# Patient Record
Sex: Female | Born: 1960 | Race: White | Hispanic: No | Marital: Single | Smoking: Never smoker
Health system: Southern US, Community
[De-identification: ages and names within clinical notes are randomized; demographics above are authoritative.]

## PROBLEM LIST (undated history)

## (undated) DIAGNOSIS — I4891 Unspecified atrial fibrillation: Secondary | ICD-10-CM

## (undated) DIAGNOSIS — K219 Gastro-esophageal reflux disease without esophagitis: Secondary | ICD-10-CM

## (undated) HISTORY — PX: EYE SURGERY: SHX253

## (undated) HISTORY — DX: Gastro-esophageal reflux disease without esophagitis: K21.9

---

## 2020-03-19 ENCOUNTER — Other Ambulatory Visit: Payer: Self-pay

## 2020-03-19 ENCOUNTER — Emergency Department
Admission: EM | Admit: 2020-03-19 | Discharge: 2020-03-19 | Disposition: A | Discharge status: Home / Self Care | Payer: Self-pay | Attending: Emergency Medicine | Admitting: Emergency Medicine

## 2020-03-19 ENCOUNTER — Emergency Department: Payer: Self-pay

## 2020-03-19 ENCOUNTER — Encounter: Payer: Self-pay | Admitting: Emergency Medicine

## 2020-03-19 DIAGNOSIS — M25512 Pain in left shoulder: Secondary | ICD-10-CM | POA: Insufficient documentation

## 2020-03-19 DIAGNOSIS — R002 Palpitations: Secondary | ICD-10-CM | POA: Insufficient documentation

## 2020-03-19 DIAGNOSIS — M79622 Pain in left upper arm: Secondary | ICD-10-CM | POA: Insufficient documentation

## 2020-03-19 HISTORY — DX: Unspecified atrial fibrillation: I48.91

## 2020-03-19 LAB — TROPONIN I (HIGH SENSITIVITY)
Troponin I (High Sensitivity): 4 ng/L (ref <18)
Troponin I (High Sensitivity): 4 ng/L (ref <18)

## 2020-03-19 LAB — CBC
HCT: 44.8 % (ref 36.0–46.0)
Hemoglobin: 14.8 g/dL (ref 12.0–15.0)
MCH: 30.3 pg (ref 26.0–34.0)
MCHC: 33 g/dL (ref 30.0–36.0)
MCV: 91.6 fL (ref 80.0–100.0)
Platelets: 377 10*3/uL (ref 150–400)
RBC: 4.89 MIL/uL (ref 3.87–5.11)
RDW: 12.5 % (ref 11.5–15.5)
WBC: 13.4 10*3/uL — ABNORMAL HIGH (ref 4.0–10.5)
nRBC: 0 % (ref 0.0–0.2)

## 2020-03-19 LAB — BASIC METABOLIC PANEL
Anion gap: 9 (ref 5–15)
BUN: 14 mg/dL (ref 8–23)
CO2: 23 mmol/L (ref 22–32)
Calcium: 10 mg/dL (ref 8.9–10.3)
Chloride: 102 mmol/L (ref 98–111)
Creatinine, Ser: 0.64 mg/dL (ref 0.44–1.00)
GFR, Estimated: >60 mL/min (ref >60)
Glucose, Bld: 121 mg/dL — ABNORMAL HIGH (ref 70–99)
Potassium: 3.8 mmol/L (ref 3.5–5.1)
Sodium: 134 mmol/L — ABNORMAL LOW (ref 135–145)

## 2020-03-19 LAB — D-DIMER, QUANTITATIVE: D-Dimer, Quant: <0.27 ug/mL-FEU (ref 0.00–0.50)

## 2020-03-19 MED ORDER — DIAZEPAM 2 MG PO TABS: 2.0000 mg | ORAL_TABLET | Freq: Two times a day (BID) | ORAL | 0 refills | Status: AC | PRN

## 2020-03-19 NOTE — Discharge Instructions (Signed)
Return to the ER immediately for new, worsening, or persistent severe chest pain, palpitations, heart racing, or any of these episodes associated with dizziness, lightheadedness, or shortness of breath.  You may take the Valium if you have an episode of anxiety or palpitations that does not get better after a short time.  If these episodes continue, it is very important for you to follow-up with a cardiologist.  We have provided referral information.

## 2020-03-19 NOTE — ED Provider Notes (Signed)
[facility] Emergency Department Provider Note ____________________________________________   Event Date/Time   First MD Initiated Contact with Patient 03/19/20 1034     (approximate)  I have reviewed the triage vital signs and the nursing notes.   HISTORY  Chief Complaint Chest Pain    HPI Angela Owens is a 60 y.o. female with GERD but no other active medical problems who presents with intermittent chest tightness over the last 3 days, nonexertional, and associated sometimes with pain in the left shoulder and axilla.  The patient reports that she has had this shoulder pain previously.  She states that associated with this she has had several episodes where her heart rate went up to the 110s to 130s, and one time 2 days ago it went up to 160.  These rates were measured on her pulse oximeter at home.  When the heart rate went up she did not note any lightheadedness, near syncope, or shortness of breath.  The patient reports that some of the symptoms have been going on for over a month since her mother and dog both passed away within a short time.  She reports increased stress and anxiety since this time.  She states that she never had symptoms like this before those deaths.  There are no problems to display for this patient.   History reviewed. No pertinent surgical history.  Prior to Admission medications   Medication Sig Start Date End Date Taking? Authorizing Provider  diazepam (VALIUM) 2 MG tablet Take 1 tablet (2 mg total) by mouth every 12 (twelve) hours as needed for anxiety. 03/19/20 03/19/21 Yes Dionne Bucy, MD    Allergies Patient has no known allergies.  No family history on file.  Social History Social History   Tobacco Use  . Smoking status: Never Smoker  . Smokeless tobacco: Never Used  Substance Use Topics  . Alcohol use: Not Currently  . Drug use: Not Currently    Review of Systems  Constitutional: No fever/chills. Eyes:  No visual changes. ENT: No sore throat. Cardiovascular: Positive for intermittent chest pain. Respiratory: Denies shortness of breath. Gastrointestinal: Positive for nausea.  No vomiting or diarrhea. Genitourinary: Negative for dysuria.  Musculoskeletal: Negative for back pain. Skin: Negative for rash. Neurological: Negative for headache.   ____________________________________________   PHYSICAL EXAM:  VITAL SIGNS: ED Triage Vitals  Enc Vitals Group     BP 03/19/20 1011 (!) 153/96     Pulse Rate 03/19/20 1011 99     Resp 03/19/20 1011 14     Temp 03/19/20 1011 98.6 F (37 C)     Temp Source 03/19/20 1011 Oral     SpO2 03/19/20 1011 99 %     Weight 03/19/20 1009 160 lb (72.6 kg)     Height 03/19/20 1009 5\' 5"  (1.651 m)     Head Circumference --      Peak Flow --      Pain Score 03/19/20 1009 2     Pain Loc --      Pain Edu? --      Excl. in GC? --     Constitutional: Alert and oriented.  Anxious appearing, intermittently tearful, but in no acute distress. Eyes: Conjunctivae are normal.  Head: Atraumatic. Nose: No congestion/rhinnorhea. Mouth/Throat: Mucous membranes are moist.   Neck: Normal range of motion.  Cardiovascular: Normal rate, regular rhythm. Grossly normal heart sounds.  Good peripheral circulation. Respiratory: Normal respiratory effort.  No retractions. Lungs CTAB. Gastrointestinal: No distention.  Musculoskeletal: No lower extremity edema.  Extremities warm and well perfused.  Neurologic:  Normal speech and language. No gross focal neurologic deficits are appreciated.  Skin:  Skin is warm and dry. No rash noted. Psychiatric: Mood and affect are normal. Speech and behavior are normal.  ____________________________________________   LABS (all labs ordered are listed, but only abnormal results are displayed)  Labs Reviewed  BASIC METABOLIC PANEL - Abnormal; Notable for the following components:      Result Value   Sodium 134 (*)    Glucose, Bld  121 (*)    All other components within normal limits  CBC - Abnormal; Notable for the following components:   WBC 13.4 (*)    All other components within normal limits  D-DIMER, QUANTITATIVE  TROPONIN I (HIGH SENSITIVITY)  TROPONIN I (HIGH SENSITIVITY)   ____________________________________________  EKG  ED ECG REPORT I, Dionne Bucy, the attending physician, personally viewed and interpreted this ECG.  Date: 03/19/2020 EKG Time: 1010 Rate: 97 Rhythm: normal sinus rhythm QRS Axis: normal Intervals: normal ST/T Wave abnormalities: Nonspecific T wave abnormality Narrative Interpretation: Nonspecific abnormalities with no evidence of acute ischemia  ____________________________________________  RADIOLOGY  Chest x-ray interpreted by me shows no focal infiltrate or edema  ____________________________________________   PROCEDURES  Procedure(s) performed: No  Procedures  Critical Care performed: No ____________________________________________   INITIAL IMPRESSION / ASSESSMENT AND PLAN / ED COURSE  Pertinent labs & imaging results that were available during my care of the patient were reviewed by me and considered in my medical decision making (see chart for details).  60 year old female with no active medical problems presents with atypical chest discomfort over the last few days radiating to the left shoulder and arm along with intermittent episodes of tachycardia although no near syncope or significant shortness of breath during these episodes.  She overall endorses increased stress since recent deaths in the family and states that all of the symptoms started after this.  She has no prior cardiac history (atrial fibrillation was incorrectly documented by the triage RN).  On exam, the patient is anxious and intermittently tearful when relating her stressors but otherwise well-appearing.  Her vital signs are normal except that her heart rate went up to about 110  while she was talking about stressors.  The physical exam is otherwise unremarkable.  EKG is nonischemic.  Overall presentation is consistent with intermittent sinus tachycardia due to anxiety.  The etiology of the chest pain is also likely musculoskeletal, anxiety related, or another benign cause.  I have a low suspicion for ACS.  There is no evidence of PE, aortic dissection, or other vascular etiology given the intermittent nature of the symptoms.  It is possible that the patient could be having paroxysmal atrial fibrillation or another arrhythmia although her EKG here is in sinus and she has maintained a sinus rhythm on the monitor.  We will obtain basic labs, troponins x2, and reassess.  ----------------------------------------- 3:00 PM on 03/19/2020 -----------------------------------------  D-dimer and troponins x2 are all negative.  The chest x-ray shows no acute abnormality.  The patient has had no further episodes of chest tightness or palpitations in the ED and has had no tachydysrhythmia recorded on the monitor.  Given this negative work-up, the patient is appropriate for discharge home.  I recommend cardiology follow-up as she may need a Holter monitor if the symptoms continue.  The patient has no insurance at this time but agreed to follow-up.  I have also prescribed a small quantity  of low-dose Valium for episodes of breakthrough anxiety over the next few days although I discussed the risks of this medication including dependence.  I gave the patient thorough return precautions and she expressed understanding.  ____________________________________________   FINAL CLINICAL IMPRESSION(S) / ED DIAGNOSES  Final diagnoses:  Palpitations      NEW MEDICATIONS STARTED DURING THIS VISIT:  Discharge Medication List as of 03/19/2020  2:42 PM    START taking these medications   Details  diazepam (VALIUM) 2 MG tablet Take 1 tablet (2 mg total) by mouth every 12 (twelve) hours as  needed for anxiety., Starting Fri 03/19/2020, Until Sat 03/19/2021 at 2359, Normal         Note:  This document was prepared using Dragon voice recognition software and may include unintentional dictation errors.   Dionne Bucy, MD 03/19/20 [phone]

## 2020-03-19 NOTE — ED Triage Notes (Signed)
First Nurse Note:  C/O chest pain radiating to left arm and neck x 2 days.  Patient moved to room 13.

## 2020-03-19 NOTE — ED Triage Notes (Signed)
Pt c/o generalized chest tightness since Wednesday at 0400, states radiates to L axilla, states feels nauseated at this time. Pt states has had A-fib attacks up to the 160's. Pt states her mother died approx 2 months ago and her dog died 3 weeks later and states this maybe related. Upon arrival pt with noted mild difficulty speaking, however when speaking with EDT about dog pt noted to be able to speak without difficulty.

## 2020-10-17 ENCOUNTER — Other Ambulatory Visit: Payer: Self-pay

## 2020-10-17 ENCOUNTER — Encounter: Payer: Self-pay | Admitting: Emergency Medicine

## 2020-10-17 ENCOUNTER — Ambulatory Visit
Admission: EM | Admit: 2020-10-17 | Discharge: 2020-10-17 | Disposition: A | Discharge status: Home / Self Care | Payer: Self-pay

## 2020-10-17 DIAGNOSIS — R197 Diarrhea, unspecified: Secondary | ICD-10-CM

## 2020-10-17 NOTE — ED Provider Notes (Signed)
[facility]    CSN: [id number] Arrival date & time: 10/17/20  1914      History   Chief Complaint Chief Complaint  Patient presents with   Diarrhea    HPI Angela Owens is a 60 y.o. female presenting for 1 week history of intermittent loose stools.  Patient says 1 week ago she had about 4-5 episodes of diarrhea a day for couple of days.  Then she says she did not have any diarrhea for 2 days and over the past couple days she has had increased diarrhea again up to 3 episodes per day.  She says she is already had 2 episodes of loose stools today.  She says most of the time she has loose stools in the morning.  She denies any associated fever, fatigue, chills, body aches, abdominal pain, nausea/vomiting, appetite changes, bloating or belching.  Patient denies any bloody stools.  She has been taking Pepto-Bismol and says stools are a little dark due to this.  She admits to eating at [facility] before onset of symptoms but says the chicken was cooked.  She denies any recent travel and has not been on any antibiotics recently.  Denies any contacts with similar symptoms.  Also she does not report any cough, congestion or sore throat and no known COVID exposure.  No history of IBD or IBS.  She does have history of GERD and takes Nexium daily.  She has no other complaints.  HPI  Past Medical History:  Diagnosis Date   A-fib ([facility])     There are no problems to display for this patient.   History reviewed. No pertinent surgical history.  OB History   No obstetric history on file.      Home Medications    Prior to Admission medications   Medication Sig Start Date End Date Taking? Authorizing Provider  esomeprazole (NEXIUM) 20 MG capsule Take 20 mg by mouth daily at 12 noon.   Yes [provider]  diazepam (VALIUM) 2 MG tablet Take 1 tablet (2 mg total) by mouth every 12 (twelve) hours as needed for anxiety. 03/19/20 03/19/21  Dionne Bucy, MD    Family  History History reviewed. No pertinent family history.  Social History Social History   Tobacco Use   Smoking status: Never   Smokeless tobacco: Never  Vaping Use   Vaping Use: Never used  Substance Use Topics   Alcohol use: Not Currently   Drug use: Not Currently     Allergies   Patient has no known allergies.   Review of Systems Review of Systems  Constitutional:  Negative for appetite change, fatigue and fever.  HENT:  Negative for congestion and sore throat.   Respiratory:  Negative for cough.   Gastrointestinal:  Positive for diarrhea. Negative for abdominal distention, abdominal pain, anal bleeding, blood in stool, constipation, nausea, rectal pain and vomiting.  Neurological:  Negative for dizziness and weakness.    Physical Exam Triage Vital Signs ED Triage Vitals  Enc Vitals Group     BP 10/17/20 0841 (!) 140/99     Pulse Rate 10/17/20 0841 88     Resp 10/17/20 0841 14     Temp 10/17/20 0841 98.8 F (37.1 C)     Temp Source 10/17/20 0841 Oral     SpO2 10/17/20 0841 99 %     Weight 10/17/20 0837 160 lb 0.9 oz (72.6 kg)     Height 10/17/20 0837 5\' 5"  (1.651 m)  Head Circumference --      Peak Flow --      Pain Score 10/17/20 0837 0     Pain Loc --      Pain Edu? --      Excl. in GC? --    No data found.  Updated Vital Signs BP (!) 140/99 (BP Location: Left Arm)   Pulse 88   Temp 98.8 F (37.1 C) (Oral)   Resp 14   Ht 5\' 5"  (1.651 m)   Wt 160 lb 0.9 oz (72.6 kg)   SpO2 99%   BMI 26.63 kg/m      Physical Exam Vitals and nursing note reviewed.  Constitutional:      General: She is not in acute distress.    Appearance: Normal appearance. She is not ill-appearing or toxic-appearing.  HENT:     Head: Normocephalic and atraumatic.     Nose: Nose normal.     Mouth/Throat:     Mouth: Mucous membranes are moist.     Pharynx: Oropharynx is clear.  Eyes:     General: No scleral icterus.       Right eye: No discharge.        Left eye: No  discharge.     Conjunctiva/sclera: Conjunctivae normal.  Cardiovascular:     Rate and Rhythm: Normal rate and regular rhythm.     Heart sounds: Normal heart sounds.  Pulmonary:     Effort: Pulmonary effort is normal. No respiratory distress.     Breath sounds: Normal breath sounds.  Abdominal:     Palpations: Abdomen is soft.     Tenderness: There is no abdominal tenderness.  Musculoskeletal:     Cervical back: Neck supple.  Skin:    General: Skin is dry.  Neurological:     General: No focal deficit present.     Mental Status: She is alert. Mental status is at baseline.     Motor: No weakness.     Gait: Gait normal.  Psychiatric:        Mood and Affect: Mood normal.        Behavior: Behavior normal.        Thought Content: Thought content normal.     UC Treatments / Results  Labs (all labs ordered are listed, but only abnormal results are displayed) Labs Reviewed - No data to display  EKG   Radiology No results found.  Procedures Procedures (including critical care time)  Medications Ordered in UC Medications - No data to display  Initial Impression / Assessment and Plan / UC Course  I have reviewed the triage vital signs and the nursing notes.  Pertinent labs & imaging results that were available during my care of the patient were reviewed by me and considered in my medical decision making (see chart for details).  60 year old female presenting for 1 week history of intermittent diarrhea.  Symptoms seem to get better for couple days but then worsened over the past 2 to 3 days.  Patient admits to up to 3 episodes of diarrhea per day but has been taking Pepto-Bismol.  No systemic symptoms or red flag signs or symptoms.  No recent antibiotic use or travel.  Vitals are stable.  She is afebrile and overall well-appearing.  She does not admit to feeling badly.  She is concerned because of how long she has had the diarrhea.  Exam is reassuring.  Normal bowel sounds and  abdomen is soft and nontender.  I did discuss  with patient that her symptoms likely due to viral gastroenteritis and suggested continue with supportive care but may be switching to Imodium and staying hydrated.  Patient did want to consider stool studies.  I have given her samples to collect GI panel by PCR and C. difficile.  Patient to wait 2 to 3 days before returning with samples unless symptoms worsen.  Reviewed ED precautions for diarrhea with patient.  Final Clinical Impressions(s) / UC Diagnoses   Final diagnoses:  Diarrhea, unspecified type     Discharge Instructions      -As we discussed there are many different causes for diarrhea.  I have low suspicion for infectious cause given that you do not have fever or blood in the stool and you do not feel poorly and denies any abdominal pain or bloating.  Additionally you are reporting 2-3 abnormal stools per day when most of the time people complain of 4-5 episodes per day with infectious causes. -Stop Pepto-Bismol and try Imodium and make sure you are staying hydrated.  Also see the handout on food choices to help relieve diarrhea.  If still having symptoms in 2 to 3 days or symptoms worsen, return with a stool sample so we can check you for infectious causes of diarrhea. -For any acute worsening symptoms such as if you develop abdominal pain, fever, weakness, dehydration, etc. please go to the emergency department.     ED Prescriptions   None    PDMP not reviewed this encounter.   Shirlee Latch, PA-C 10/17/20 [phone]

## 2020-10-17 NOTE — ED Triage Notes (Signed)
Patient reports having diarrhea for the past week.  Patient states that she went to a football game party and ate some chicken.  Patient reports 2 episodes of diarrhea for the past 2-3 days.  Patient denies fevers.

## 2020-10-17 NOTE — Discharge Instructions (Signed)
-  As we discussed there are many different causes for diarrhea.  I have low suspicion for infectious cause given that you do not have fever or blood in the stool and you do not feel poorly and denies any abdominal pain or bloating.  Additionally you are reporting 2-3 abnormal stools per day when most of the time people complain of 4-5 episodes per day with infectious causes. -Stop Pepto-Bismol and try Imodium and make sure you are staying hydrated.  Also see the handout on food choices to help relieve diarrhea.  If still having symptoms in 2 to 3 days or symptoms worsen, return with a stool sample so we can check you for infectious causes of diarrhea. -For any acute worsening symptoms such as if you develop abdominal pain, fever, weakness, dehydration, etc. please go to the emergency department.

## 2020-10-18 ENCOUNTER — Other Ambulatory Visit
Admission: RE | Admit: 2020-10-18 | Discharge: 2020-10-18 | Disposition: A | Discharge status: Home / Self Care | Payer: Self-pay | Source: Ambulatory Visit | Attending: Physician Assistant | Admitting: Physician Assistant

## 2020-10-18 DIAGNOSIS — R197 Diarrhea, unspecified: Secondary | ICD-10-CM | POA: Insufficient documentation

## 2020-10-18 LAB — GASTROINTESTINAL PANEL BY PCR, STOOL (REPLACES STOOL CULTURE)

## 2020-10-18 LAB — C DIFFICILE QUICK SCREEN W PCR REFLEX
C Diff antigen: NEGATIVE
C Diff interpretation: NOT DETECTED
C Diff toxin: NEGATIVE

## 2021-01-19 ENCOUNTER — Ambulatory Visit
Admission: EM | Admit: 2021-01-19 | Discharge: 2021-01-19 | Disposition: A | Discharge status: Home / Self Care | Payer: Self-pay | Attending: Medical Oncology | Admitting: Medical Oncology

## 2021-01-19 ENCOUNTER — Other Ambulatory Visit: Payer: Self-pay

## 2021-01-19 DIAGNOSIS — R21 Rash and other nonspecific skin eruption: Secondary | ICD-10-CM

## 2021-01-19 MED ORDER — CLINDAMYCIN HCL 300 MG PO CAPS: 300.0000 mg | ORAL_CAPSULE | Freq: Three times a day (TID) | ORAL | 0 refills | Status: AC

## 2021-01-19 MED ORDER — FLUCONAZOLE 150 MG PO TABS: 150.0000 mg | ORAL_TABLET | Freq: Every day | ORAL | 0 refills | Status: DC

## 2021-01-19 NOTE — ED Triage Notes (Signed)
Pt here with C/O rash under arm pits for 2 months, started right after shaving. Has not been able to get rid of it, woke up with morning and it has spread to top of breast and back.

## 2021-01-19 NOTE — ED Provider Notes (Signed)
[facility]    CSN: [id number] Arrival date & time: 01/19/21  0846      History   Chief Complaint Chief Complaint  Patient presents with   Rash    HPI Angela Owens is a 61 y.o. female.   HPI  Rash: Patient states that she has had a rash of her underarms for the past 2 months. Started after shaving. No new products when symptoms started but has now started using a new body wash and oatmeal lotion. Has tried salicylic acid pads and benzyl peroxide wash along with topical antibacterial with some scattered improvement. As of today she has a rash that spreads below underarms to be bilateral breast. No itching, pain or fevers.   Past Medical History:  Diagnosis Date   A-fib ([place])     There are no problems to display for this patient.   History reviewed. No pertinent surgical history.  OB History   No obstetric history on file.      Home Medications    Prior to Admission medications   Medication Sig Start Date End Date Taking? Authorizing Provider  diazepam (VALIUM) 2 MG tablet Take 1 tablet (2 mg total) by mouth every 12 (twelve) hours as needed for anxiety. 03/19/20 03/19/21 Yes Arta Silence, MD  esomeprazole (NEXIUM) 20 MG capsule Take 20 mg by mouth daily at 12 noon.   Yes [provider]    Family History History reviewed. No pertinent family history.  Social History Social History   Tobacco Use   Smoking status: Never   Smokeless tobacco: Never  Vaping Use   Vaping Use: Never used  Substance Use Topics   Alcohol use: Not Currently   Drug use: Not Currently     Allergies   Patient has no known allergies.   Review of Systems Review of Systems  As stated above in HPI Physical Exam Triage Vital Signs ED Triage Vitals [01/19/21 0945]  Enc Vitals Group     BP (!) 155/100     Pulse Rate 85     Resp 18     Temp 99 F (37.2 C)     Temp Source Oral     SpO2 98 %     Weight      Height      Head Circumference      Peak  Flow      Pain Score 0     Pain Loc      Pain Edu?      Excl. in [place]?    No data found.  Updated Vital Signs BP (!) 155/100 (BP Location: Right Arm) Comment: Pt had a upset moment right before taking BP   Pulse 85    Temp 99 F (37.2 C) (Oral)    Resp 18    SpO2 98%   Physical Exam Cardiovascular:     Rate and Rhythm: Normal rate and regular rhythm.     Heart sounds: Normal heart sounds.  Pulmonary:     Effort: Pulmonary effort is normal.     Breath sounds: Normal breath sounds.  Skin:    General: Skin is warm.     Comments: See below        UC Treatments / Results  Labs (all labs ordered are listed, but only abnormal results are displayed) Labs Reviewed - No data to display  EKG   Radiology No results found.  Procedures Procedures (including critical care time)  Medications Ordered in UC Medications - No  data to display  Initial Impression / Assessment and Plan / UC Course  I have reviewed the triage vital signs and the nursing notes.  Pertinent labs & imaging results that were available during my care of the patient were reviewed by me and considered in my medical decision making (see chart for details).     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 Treating with clindamycin and diflucan. Discussed with patient along with avoidance of items that can cause irritation. She will wash and avoid use of scented soaps.    Final Clinical Impressions(s) / UC Diagnoses   Final diagnoses:  None   Discharge Instructions   None    ED Prescriptions   None    PDMP not reviewed this encounter.   Hughie Closs, [place] 01/19/21 1037

## 2022-03-17 IMAGING — CR DG CHEST 2V
2 series · 2 of 2 positions shown · non-contrast
Comparison: None.

CLINICAL DATA: Chest pain

EXAM:
CHEST - 2 VIEW

[chest pa]
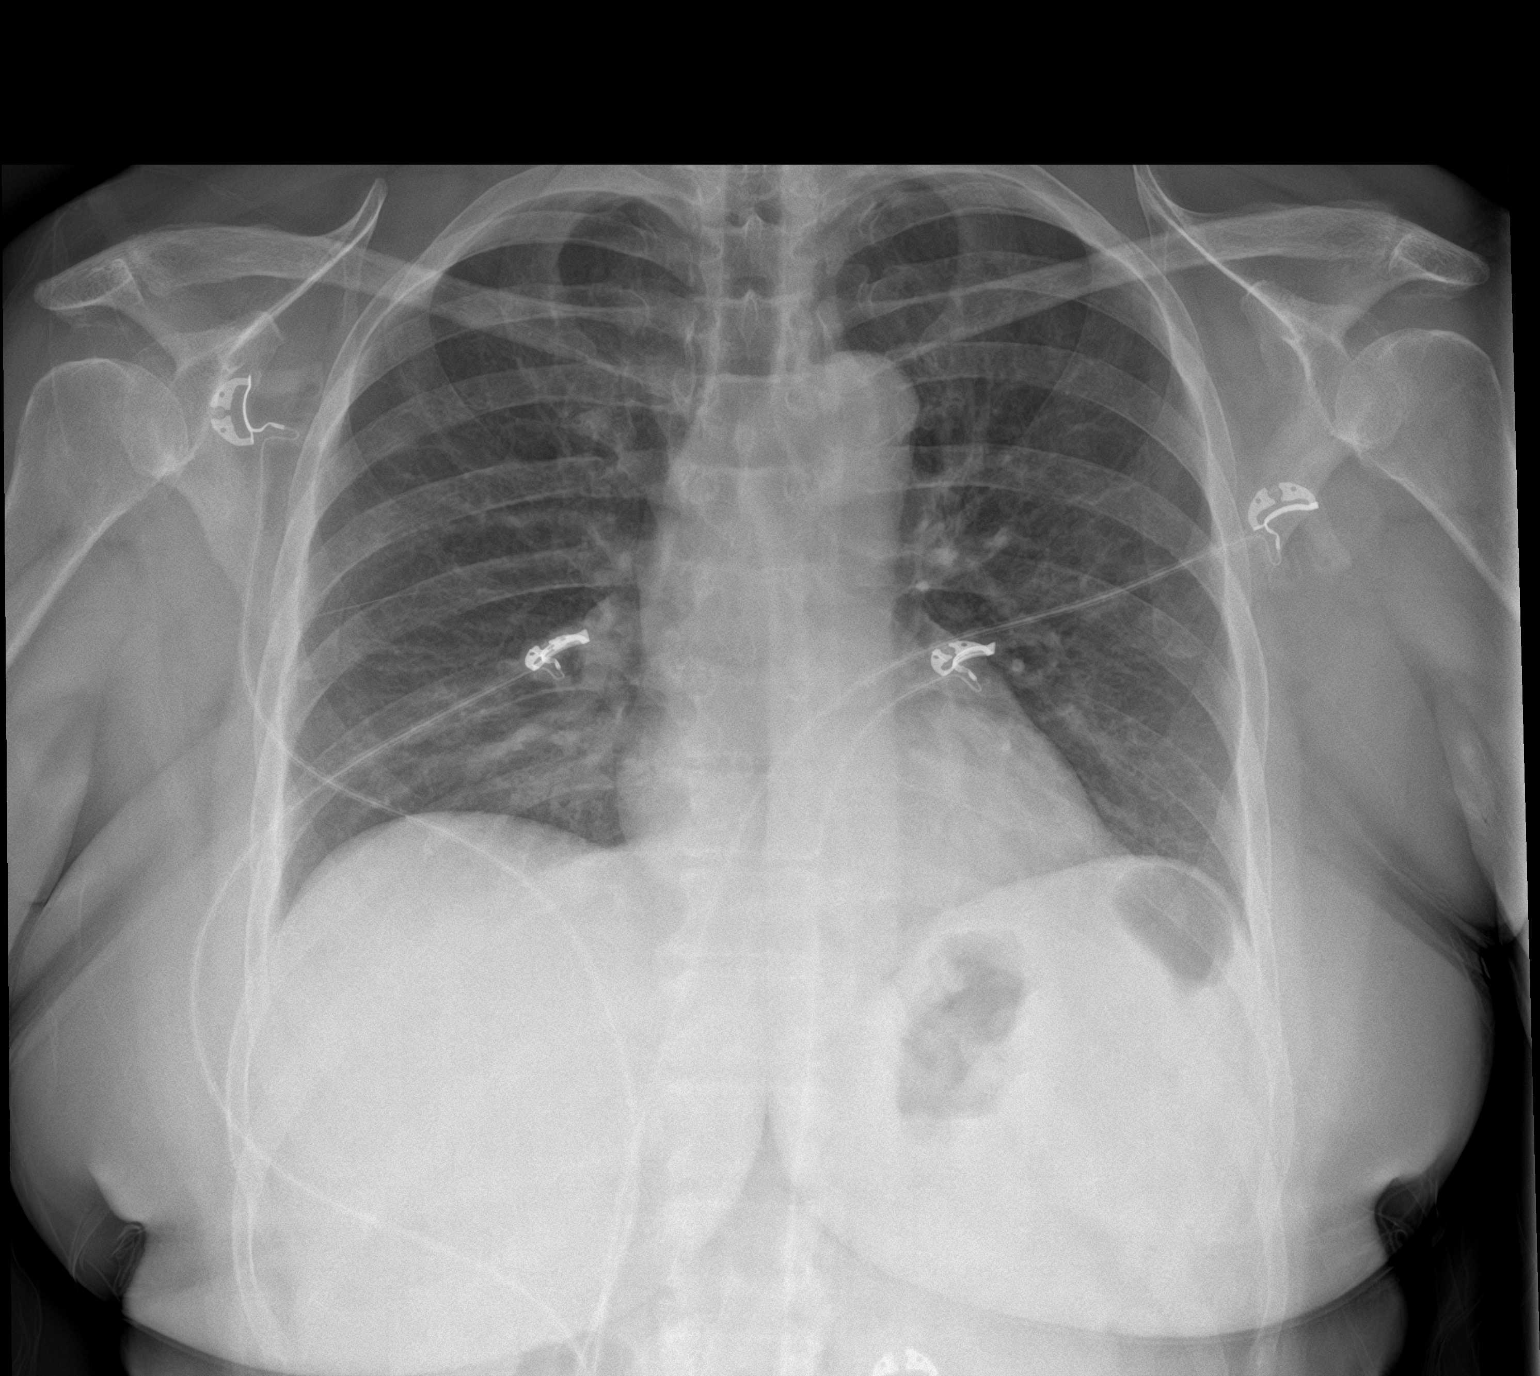

[chest lat]
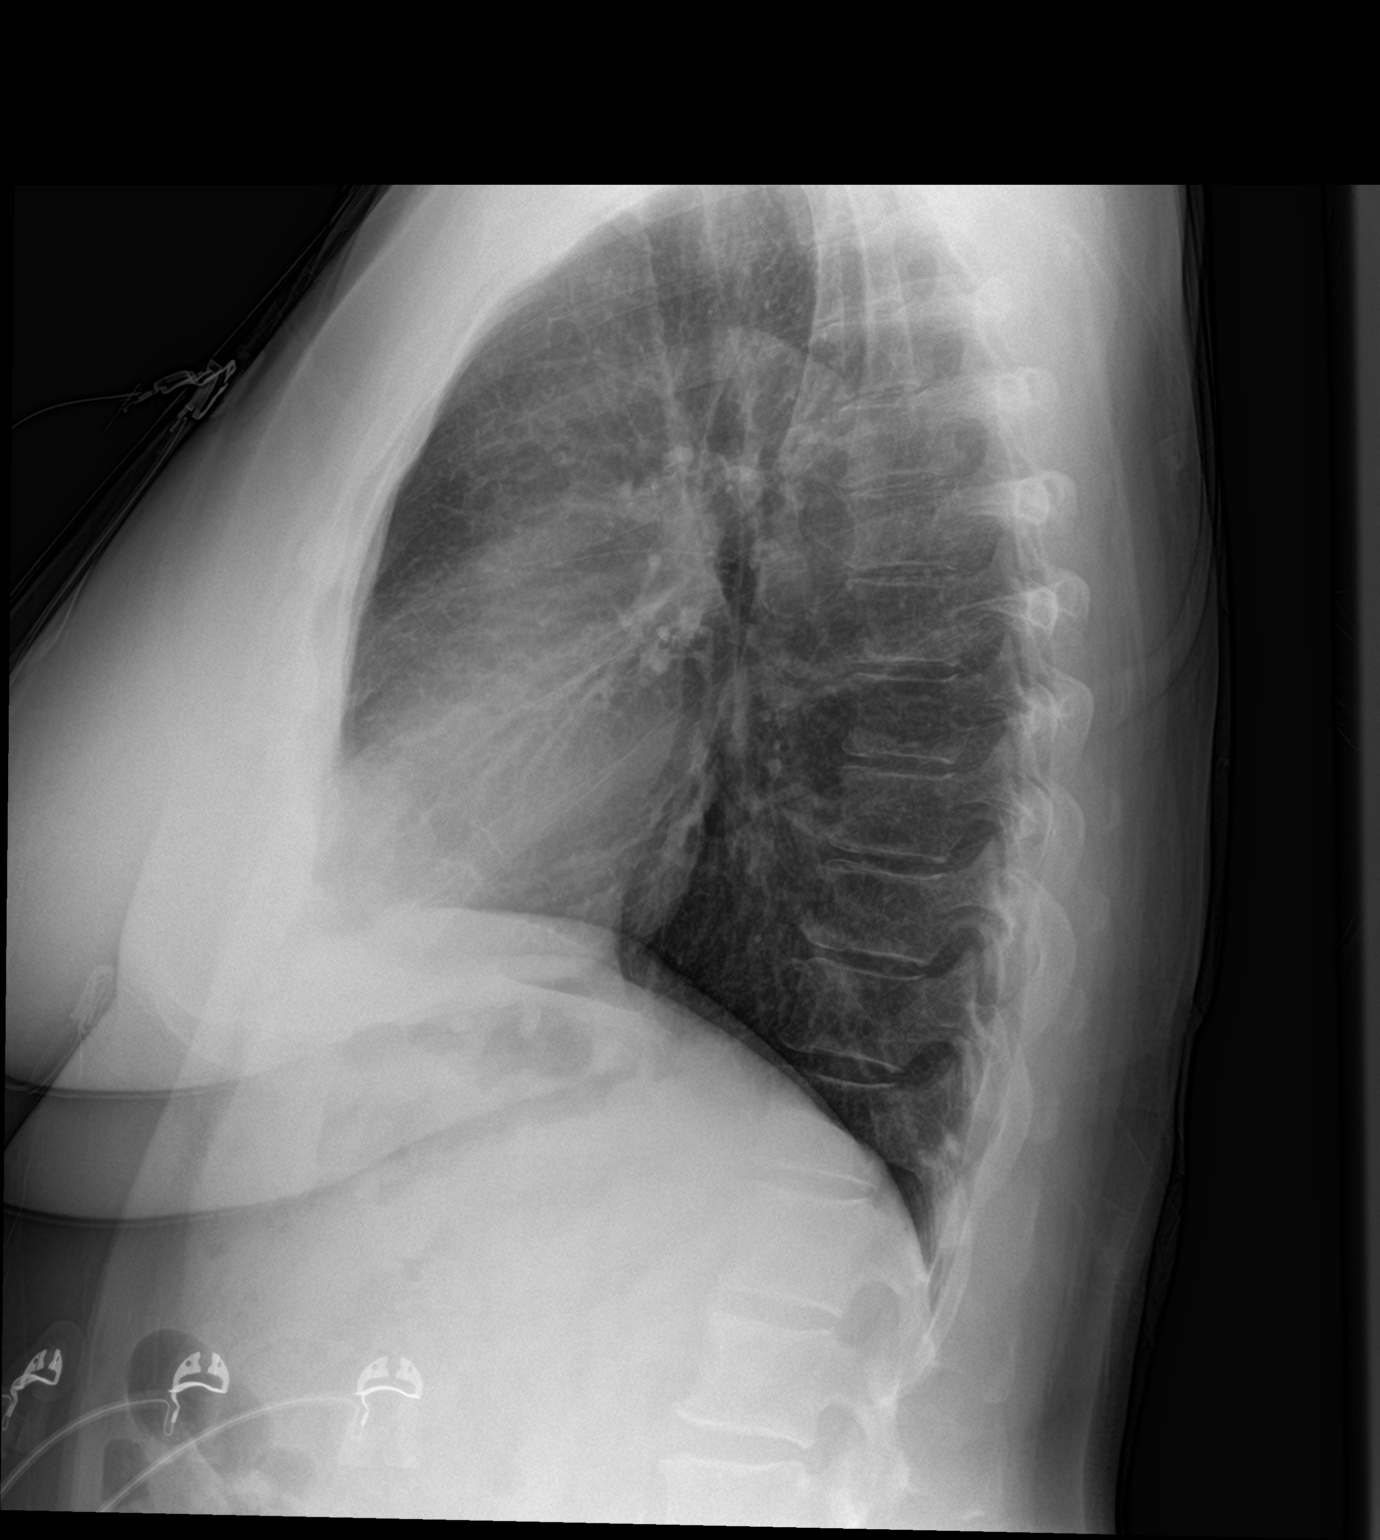

[2 of 2 positions shown; findings below may reference images not displayed]

FINDINGS: Heart and mediastinal contours are within normal limits. No focal
opacities or effusions. No acute bony abnormality.
IMPRESSION: No active cardiopulmonary disease.

## 2023-09-12 ENCOUNTER — Encounter: Payer: Self-pay | Admitting: Family Medicine

## 2023-09-12 ENCOUNTER — Ambulatory Visit (INDEPENDENT_AMBULATORY_CARE_PROVIDER_SITE_OTHER): Payer: Self-pay | Admitting: Family Medicine

## 2023-09-12 VITALS — BP 160/94 | HR 67 | Resp 16 | Ht 65.0 in | Wt 171.6 lb

## 2023-09-12 DIAGNOSIS — Z131 Encounter for screening for diabetes mellitus: Secondary | ICD-10-CM

## 2023-09-12 DIAGNOSIS — R030 Elevated blood-pressure reading, without diagnosis of hypertension: Secondary | ICD-10-CM

## 2023-09-12 DIAGNOSIS — K219 Gastro-esophageal reflux disease without esophagitis: Secondary | ICD-10-CM

## 2023-09-12 DIAGNOSIS — Z7689 Persons encountering health services in other specified circumstances: Secondary | ICD-10-CM

## 2023-09-12 DIAGNOSIS — Z13220 Encounter for screening for lipoid disorders: Secondary | ICD-10-CM

## 2023-09-12 DIAGNOSIS — Z1329 Encounter for screening for other suspected endocrine disorder: Secondary | ICD-10-CM

## 2023-09-12 NOTE — Progress Notes (Signed)
 New Patient Office Visit  Subjective   Patient ID: Angela Owens, female    DOB: June 23, 1960  Age: 63 y.o. MRN: [id number]  CC:  Chief Complaint  Patient presents with   Establish Care   Discussed the use of AI scribe software for clinical note transcription with the patient, who gave verbal consent to proceed.  History of Present Illness   Angela Owens is a 63 year old female who presents to establish care at [facility] after recently obtaining Medicare.  She has a history of atrial fibrillation that occurred during a massive panic attack following her mother's death. She was evaluated in the emergency room with an EKG showing NSR, blood work, and other tests to rule out heart attack and stroke, all of which were normal. She was treated with Valium  and has not experienced similar episodes since. No current symptoms of heart problems or panic attacks.  She has long-standing gastroesophageal reflux disease (GERD) that began at age 21. She has been taking Nexium for 35 years after switching from Prilosec due to its ineffectiveness. Attempts to discontinue Nexium resulted in increased acid production, so she continues to take it regularly. She has also tried Zantac in the past but has not used it for many years. She reports that as long as she takes Nexium, she does not have a problem with acid reflux.  She reports elevated blood pressure readings in medical settings, which she attributes to 'white coat hypertension.' She monitors her blood pressure at home using an Omron machine, noting that her readings are typically lower outside of medical environments. She provided a log of her home blood pressure readings taken over the past four days.  She has not had a comprehensive blood workup since her emergency room visit, where her kidney and liver function were checked and reported as normal. She is unsure of her current cholesterol levels and dietary impact on her health, noting that she eats 'like a  ten year old' and lives alone. She tries to avoid sugar and reads labels to manage her salt intake, favoring low-salt options like [employer] dinners.  She has never smoked and has not been sexually active since her boyfriend passed away in 09/29/2016. She has no plans to undergo further Pap smears, as she is past the age of 36 and not sexually active.   Home blood pressure readings: Average 125/74  123/75, 130/73 Sunday  120/77, 129/74 2023/09/30  123/71, 130/74 Tuesday  119/72, 128/75 Wednesday  HR 70-80s   Outpatient Encounter Medications as of 09/12/2023  Medication Sig   Coenzyme Q10 (CO Q-10) 100 MG CAPS Take by mouth.   [employer] Omega-3 Krill Oil 500 MG CAPS Take by mouth.   esomeprazole (NEXIUM) 20 MG capsule Take 20 mg by mouth daily at 12 noon.   Multiple Vitamins-Minerals (CENTRUM ADULTS) TABS Take by mouth.   Probiotic Product (PROBIOTIC & ACIDOPHILUS EX ST PO) Take by mouth.   [DISCONTINUED] fluconazole  (DIFLUCAN ) 150 MG tablet Take 1 tablet (150 mg total) by mouth daily.   No facility-administered encounter medications on file as of 09/12/2023.    There are no active problems to display for this patient.  Past Medical History:  Diagnosis Date   GERD (gastroesophageal reflux disease) 1990   Past Surgical History:  Procedure Laterality Date   EYE SURGERY  09-30-1998   Lasik   Family History  Problem Relation Age of Onset   Chronic infections Mother    COPD Father    Social History  Socioeconomic History   Marital status: Single    Spouse name: Not on file   Number of children: Not on file   Years of education: Not on file   Highest education level: Bachelor's degree (e.g., BA, AB, BS)  Occupational History   Not on file  Tobacco Use   Smoking status: Never    Passive exposure: Never   Smokeless tobacco: Never  Vaping Use   Vaping status: Never Used  Substance and Sexual Activity   Alcohol use: Not Currently   Drug use: Never   Sexual activity: Not Currently    Birth  control/protection: Post-menopausal  Other Topics Concern   Not on file  Social History Narrative   Not on file   Social Drivers of Health   Financial Resource Strain: Low Risk  (09/05/2023)   Overall Financial Resource Strain (CARDIA)    Difficulty of Paying Living Expenses: Not hard at all  Food Insecurity: No Food Insecurity (09/05/2023)   Hunger Vital Sign    Worried About Running Out of Food in the Last Year: Never true    Ran Out of Food in the Last Year: Never true  Transportation Needs: No Transportation Needs (09/05/2023)   PRAPARE - Administrator, Civil Service (Medical): No    Lack of Transportation (Non-Medical): No  Physical Activity: Insufficiently Active (09/05/2023)   Exercise Vital Sign    Days of Exercise per Week: 3 days    Minutes of Exercise per Session: 30 min  Stress: No Stress Concern Present (09/05/2023)   [employer] of Occupational Health - Occupational Stress Questionnaire    Feeling of Stress: Not at all  Social Connections: Socially Isolated (09/05/2023)   Social Connection and Isolation Panel    Frequency of Communication with Friends and Family: More than three times a week    Frequency of Social Gatherings with Friends and Family: More than three times a week    Attends Religious Services: Never    Database administrator or Organizations: No    Attends Engineer, structural: Not on file    Marital Status: Divorced  Intimate Partner Violence: Not on file   Outpatient Medications Prior to Visit  Medication Sig Dispense Refill   Coenzyme Q10 (CO Q-10) 100 MG CAPS Take by mouth.     CVS Omega-3 Krill Oil 500 MG CAPS Take by mouth.     esomeprazole (NEXIUM) 20 MG capsule Take 20 mg by mouth daily at 12 noon.     Multiple Vitamins-Minerals (CENTRUM ADULTS) TABS Take by mouth.     Probiotic Product (PROBIOTIC & ACIDOPHILUS EX ST PO) Take by mouth.     fluconazole  (DIFLUCAN ) 150 MG tablet Take 1 tablet (150 mg total) by mouth  daily. 1 tablet 0   No facility-administered medications prior to visit.   No Known Allergies  ROS: see HPI    Objective   Today's Vitals   09/12/23 1308  BP: (!) 160/94  Pulse: 67  Resp: 16  SpO2: 98%  Weight: 171 lb 9.6 oz (77.8 kg)  Height: 5' 5 (1.651 m)  PainSc: 0-No pain    GENERAL: Well-appearing, in NAD. Well nourished.  SKIN: Pink, warm and dry. No rash, lesion, ulceration, or ecchymoses.  Head: Normocephalic. NECK: Trachea midline. Full ROM w/o pain or tenderness. No lymphadenopathy.  EARS: Tympanic membranes are intact, translucent without bulging and without drainage. Appropriate landmarks visualized.  EYES: Conjunctiva clear without exudates. EOMI, PERRL, no drainage present.  NOSE:  Septum midline w/o deformity. Nares patent, mucosa pink and non-inflamed w/o drainage. No sinus tenderness.  THROAT: Uvula midline. Oropharynx clear. Tonsils non-inflamed without exudate. Mucous membranes pink and moist.  RESPIRATORY: Chest wall symmetrical. Respirations even and non-labored. Breath sounds clear to auscultation bilaterally.  CARDIAC: S1, S2 present, regular rate and rhythm without murmur or gallops. Peripheral pulses 2+ bilaterally.  MSK: Muscle tone and strength appropriate for age. Joints w/o tenderness, redness, or swelling.  EXTREMITIES: Without clubbing, cyanosis, or edema.  NEUROLOGIC: No motor or sensory deficits. Steady, even gait. C2-C12 intact.  PSYCH/MENTAL STATUS: Alert, oriented x 3. Cooperative, appropriate mood and affect.     Assessment & Plan:   1. Encounter to establish care (Primary) Patient is a 53- year-old female who presents today to establish care with primary care at [facility]. Reviewed the past medical history, family history, social history, surgical history, medications and allergies today- updates made as indicated. Patient has concerns today about concerns about blood work.    2. Elevated blood pressure reading in office without  diagnosis of hypertension Elevated blood pressure in clinical settings, normal at home, indicating anxiety-induced elevation. Continue home blood pressure monitoring. Record blood pressure readings twice daily, including midday, for future visits. Discussed dietary habits, high salt intake, and lack of recent blood work. Declined Pap smear due to age and lack of sexual activity. Order comprehensive blood work excluding cholesterol panel. Discuss dietary modifications to reduce salt intake. - CBC with Differential/Platelet - Comprehensive metabolic panel with GFR  3. Screening for cholesterol level Schedule fasting cholesterol panel for next visit. - Lipid panel; Future  4. Screening for thyroid disorder Will obtain lab work for thyroid function.  - TSH Rfx on Abnormal to Free T4  5. Screening for diabetes mellitus Will obtain HbA1c due to history of elevated glucose in the past.  - Hemoglobin A1c  6. Gastroesophageal reflux disease, unspecified whether esophagitis present Chronic GERD well-controlled with Nexium. Attempts to discontinue Nexium unsuccessful due to increased acid production. Long-term Nexium use remains necessary and effective.     Return in about 6 weeks (around 10/24/2023) for welcome to Medicare AWV .   Evalene Arts, FNP

## 2023-09-12 NOTE — Patient Instructions (Addendum)

## 2023-09-13 LAB — COMPREHENSIVE METABOLIC PANEL WITH GFR
ALT: 14 IU/L (ref 0–32)
AST: 19 IU/L (ref 0–40)
Albumin: 4.7 g/dL (ref 3.9–4.9)
Alkaline Phosphatase: 95 IU/L (ref 44–121)
BUN/Creatinine Ratio: 20 (ref 12–28)
BUN: 16 mg/dL (ref 8–27)
Bilirubin Total: 0.2 mg/dL (ref 0.0–1.2)
CO2: 19 mmol/L — ABNORMAL LOW (ref 20–29)
Calcium: 10.4 mg/dL — ABNORMAL HIGH (ref 8.7–10.3)
Chloride: 102 mmol/L (ref 96–106)
Creatinine, Ser: 0.8 mg/dL (ref 0.57–1.00)
Globulin, Total: 2.8 g/dL (ref 1.5–4.5)
Glucose: 93 mg/dL (ref 70–99)
Potassium: 4.8 mmol/L (ref 3.5–5.2)
Sodium: 141 mmol/L (ref 134–144)
Total Protein: 7.5 g/dL (ref 6.0–8.5)
eGFR: 82 mL/min/1.73 (ref >59)

## 2023-09-13 LAB — TSH RFX ON ABNORMAL TO FREE T4: TSH: 2.49 u[IU]/mL (ref 0.450–4.500)

## 2023-09-13 LAB — CBC WITH DIFFERENTIAL/PLATELET
Basophils Absolute: 0.1 x10E3/uL (ref 0.0–0.2)
Basos: 1 %
EOS (ABSOLUTE): 0.2 x10E3/uL (ref 0.0–0.4)
Eos: 2 %
Hematocrit: 46.2 % (ref 34.0–46.6)
Hemoglobin: 15.1 g/dL (ref 11.1–15.9)
Immature Grans (Abs): 0 x10E3/uL (ref 0.0–0.1)
Immature Granulocytes: 0 %
Lymphocytes Absolute: 3.4 x10E3/uL — ABNORMAL HIGH (ref 0.7–3.1)
Lymphs: 30 %
MCH: 30.1 pg (ref 26.6–33.0)
MCHC: 32.7 g/dL (ref 31.5–35.7)
MCV: 92 fL (ref 79–97)
Monocytes Absolute: 0.7 x10E3/uL (ref 0.1–0.9)
Monocytes: 6 %
Neutrophils Absolute: 6.8 x10E3/uL (ref 1.4–7.0)
Neutrophils: 61 %
Platelets: 392 x10E3/uL (ref 150–450)
RBC: 5.02 x10E6/uL (ref 3.77–5.28)
RDW: 12.8 % (ref 11.7–15.4)
WBC: 11.1 x10E3/uL — ABNORMAL HIGH (ref 3.4–10.8)

## 2023-09-13 LAB — HEMOGLOBIN A1C
Est. average glucose Bld gHb Est-mCnc: 117 mg/dL
Hgb A1c MFr Bld: 5.7 % — ABNORMAL HIGH (ref 4.8–5.6)

## 2023-09-14 ENCOUNTER — Telehealth: Payer: Self-pay | Admitting: Family Medicine

## 2023-09-14 NOTE — Telephone Encounter (Signed)
 Angela Owens is out of the office today 8/29. Routing to her for review in case she is able to look at messages outside of the office.

## 2023-09-14 NOTE — Telephone Encounter (Signed)
 Patient walked in stating labs show her white blood cell count was elevated.  She is is scheduled for a pneumonia shot on 09/20/2023.  Want to know should she take the pneumonia shot.  Please call her to advise before the appt.

## 2023-09-19 ENCOUNTER — Ambulatory Visit: Payer: Self-pay | Admitting: Family Medicine

## 2023-09-24 ENCOUNTER — Encounter: Payer: Self-pay | Admitting: Family Medicine

## 2023-09-24 ENCOUNTER — Ambulatory Visit (INDEPENDENT_AMBULATORY_CARE_PROVIDER_SITE_OTHER): Admitting: Family Medicine

## 2023-09-24 VITALS — BP 128/91 | HR 84 | Resp 16 | Ht 65.0 in | Wt 169.0 lb

## 2023-09-24 DIAGNOSIS — Z0000 Encounter for general adult medical examination without abnormal findings: Secondary | ICD-10-CM | POA: Diagnosis not present

## 2023-09-24 NOTE — Progress Notes (Signed)
 [facility]  Annual Wellness Visit     Patient: Angela Owens, Female    DOB: [birth year], 63 y.o.   MRN: [id number]  Subjective  Chief Complaint  Patient presents with   Medicare Wellness   Discussed the use of AI scribe software for clinical note transcription with the patient, who gave verbal consent to proceed.  Angela Owens is a 63 y.o. female who presents today for her Annual Wellness Visit. She reports consuming a general diet. She engages in regular physical activity, using an elliptical machine for 5,000 steps approximately five days a week and playing golf once a week, except during the summer due to the heat. She generally feels well. She reports sleeping well.  Her sleep is good, with no issues falling or staying asleep. She mentions her sister struggles with sleep, but she does not share this problem.She does not have additional problems to discuss today.   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 She was in a band and frequently played in bars, which contributed to her decision to stop drinking. She has since distanced herself from that lifestyle and those friends.  She monitors her blood pressure at home, recording daily readings at noon. Recent readings include 122/72, 127/77, 126/76, and 118/72. Her blood pressure has always been around 130/70 since college.  She does not take any medications and has no updates to her family history. Her mother passed away after a brief illness, and she experienced significant stress managing her mother's estate, which led to a panic attack or similar episode.  No changes in vision except for difficulty with reading up close. Had Lasix surgery in 2000. No issues with distance vision or driving at night. She does not have an eye doctor or dentist and only seeks medical care when necessary. Not interested in any  preventative care/cancer screenings.      Patient Active Problem List   Diagnosis Date Noted   Encounter for initial annual wellness visit (AWV) in Medicare patient 09/24/2023   Past Medical History:  Diagnosis Date   GERD (gastroesophageal reflux disease) 1990   Past Surgical History:  Procedure Laterality Date   EYE SURGERY  2000   Lasik   Social History   Tobacco Use   Smoking status: Never    Passive exposure: Never   Smokeless tobacco: Never  Vaping Use   Vaping status: Never Used  Substance Use Topics   Alcohol use: Not Currently    Comment: quit 15 yrs   Drug use: Never   Social History   Socioeconomic History   Marital status: Single    Spouse name: Not on file   Number of children: Not on file   Years of education: Not on file   Highest education level: Bachelor's degree (e.g., BA, AB, BS)  Occupational History   Not on file  Tobacco Use   Smoking status: Never    Passive exposure: Never   Smokeless tobacco: Never  Vaping Use   Vaping status: Never Used  Substance and Sexual Activity   Alcohol use: Not Currently    Comment: quit 15 yrs   Drug use: Never   Sexual activity: Not Currently    Birth control/protection: Post-menopausal  Other Topics Concern   Not on file  Social History Narrative   Not on file   Social Drivers of Health  Financial Resource Strain: Low Risk  (09/05/2023)   Overall Financial Resource Strain (CARDIA)    Difficulty of Paying Living Expenses: Not hard at all  Food Insecurity: No Food Insecurity (09/05/2023)   Hunger Vital Sign    Worried About Running Out of Food in the Last Year: Never true    Ran Out of Food in the Last Year: Never true  Transportation Needs: No Transportation Needs (09/05/2023)   PRAPARE - Administrator, Civil Service (Medical): No    Lack of Transportation (Non-Medical): No  Physical Activity: Insufficiently Active (09/05/2023)   Exercise Vital Sign    Days of Exercise per Week: 3 days     Minutes of Exercise per Session: 30 min  Stress: No Stress Concern Present (09/05/2023)   [employer] of Occupational Health - Occupational Stress Questionnaire    Feeling of Stress: Not at all  Social Connections: Socially Isolated (09/05/2023)   Social Connection and Isolation Panel    Frequency of Communication with Friends and Family: More than three times a week    Frequency of Social Gatherings with Friends and Family: More than three times a week    Attends Religious Services: Never    Database administrator or Organizations: No    Attends Engineer, structural: Not on file    Marital Status: Divorced  Catering manager Violence: Not on file   Family Status  Relation Name Status   Mother  Deceased   Father Father Deceased  No partnership data on file   Family History  Problem Relation Age of Onset   Chronic infections Mother    COPD Father    No Known Allergies  Medications: Outpatient Medications Prior to Visit  Medication Sig   Coenzyme Q10 (CO Q-10) 100 MG CAPS Take by mouth.   CVS Omega-3 Krill Oil 500 MG CAPS Take by mouth.   esomeprazole (NEXIUM) 20 MG capsule Take 20 mg by mouth daily at 12 noon.   Multiple Vitamins-Minerals (CENTRUM ADULTS) TABS Take by mouth.   Probiotic Product (PROBIOTIC & ACIDOPHILUS EX ST PO) Take by mouth.   No facility-administered medications prior to visit.    No Known Allergies  Patient Care Team: Towana Small, FNP as PCP - General (Family Medicine)  ROS: see HPI    Objective  BP (!) 128/91   Pulse 84   Resp 16   Ht 5' 5 (1.651 m)   Wt 169 lb (76.7 kg)   SpO2 99%   BMI 28.12 kg/m  BP Readings from Last 3 Encounters:  09/24/23 (!) 128/91  09/12/23 (!) 160/94  01/19/21 (!) 155/100   125/74 average   Wt Readings from Last 3 Encounters:  09/24/23 169 lb (76.7 kg)  09/12/23 171 lb 9.6 oz (77.8 kg)  10/17/20 160 lb 0.9 oz (72.6 kg)   Physical Exam  Most recent functional status assessment:     09/24/2023    8:31 AM  In your present state of health, do you have any difficulty performing the following activities:  Hearing? 0  Vision? 0  Difficulty concentrating or making decisions? 0  Walking or climbing stairs? 0  Dressing or bathing? 0  Doing errands, shopping? 0   Most recent fall risk assessment:    09/24/2023    8:27 AM  Fall Risk   Falls in the past year? 0  Number falls in past yr: 0  Injury with Fall? 0    Most recent depression screenings:  09/24/2023    8:34 AM 09/12/2023    1:08 PM  PHQ 2/9 Scores  PHQ - 2 Score 0 0  PHQ- 9 Score 0 0   Most recent cognitive screening:    09/24/2023    8:40 AM  6CIT Screen  What Year? 0 points  What month? 0 points  What time? 0 points  Count back from 20 0 points  Months in reverse 0 points  Repeat phrase 0 points  Total Score 0 points   Most recent Audit-C alcohol use screening    09/05/2023    5:34 PM  Alcohol Use Disorder Test (AUDIT)  1. How often do you have a drink containing alcohol? 0  3. How often do you have six or more drinks on one occasion? 0   A score of 3 or more in women, and 4 or more in men indicates increased risk for alcohol abuse, EXCEPT if all of the points are from question 1   Vision/Hearing Screen: Hearing Screening - Comments:: Passed whisper/finger test   Labs completed previously   Assessment & Plan   Annual wellness visit done today including the all of the following: Reviewed patient's Family Medical History Reviewed and updated list of patient's medical providers Assessment of cognitive impairment was done Assessed patient's functional ability Established a written schedule for health screening services Health Risk Assessent Completed and Reviewed  Exercise Activities and Dietary recommendations  Goals   None     Immunization History  Administered Date(s) Administered   Influenza, Mdck, Trivalent,PF 6+ MOS(egg free) 10/02/2022   Moderna Covid-19 Fall Seasonal Vaccine  1yrs & older 10/02/2022   PNEUMOCOCCAL CONJUGATE-20 09/20/2023   Td 09/29/1994    Health Maintenance  Topic Date Due   COVID-19 Vaccine (2 - 2024-25 season) 10/10/2023 (Originally 09/17/2023)   Zoster Vaccines- Shingrix (1 of 2) 12/24/2023 (Originally 08/25/2008)   Influenza Vaccine  04/15/2024 (Originally 08/17/2023)   Cervical Cancer Screening (HPV/Pap Cotest)  09/11/2024 (Originally 08/25/1988)   MAMMOGRAM  09/11/2024 (Originally 08/25/2008)   DEXA SCAN  09/11/2024 (Originally 08/26/2023)   Colonoscopy  09/11/2024 (Originally 08/26/2003)   Hepatitis C Screening  09/11/2024 (Originally 08/25/1976)   HIV Screening  09/11/2024 (Originally 08/25/1973)   DTaP/Tdap/Td (2 - Tdap) 09/23/2024 (Originally 09/28/2004)   Medicare Annual Wellness (AWV)  09/23/2024   Pneumococcal Vaccine: 50+ Years  Completed   Hepatitis B Vaccines 19-59 Average Risk  Aged Out   HPV VACCINES  Aged Out   Meningococcal B Vaccine  Aged Out   Discussed health benefits of physical activity, and encouraged her to engage in regular exercise appropriate for her age and condition.    Problem List Items Addressed This Visit     Encounter for initial annual wellness visit (AWV) in Medicare patient - Primary   This visit was performed by a medical professional under my direct supervision. I was immediately available for consultation/collaboration. I have reviewed and agree with the Annual Wellness Visit documentation.  Return in about 1 year (around 09/23/2024) for Physical with fasting labs.     Evalene Arts, FNP

## 2023-09-24 NOTE — Patient Instructions (Signed)
  Ms. Carrera , Thank you for taking time to come for your Medicare Wellness Visit. I appreciate your ongoing commitment to your health goals. Please review the following plan we discussed and let me know if I can assist you in the future.   These are the goals we discussed:  Goals   None     This is a list of the screening recommended for you and due dates:  Health Maintenance  Topic Date Due   Medicare Annual Wellness Visit  Never done   COVID-19 Vaccine (2 - 2024-25 season) 10/10/2023*   Zoster (Shingles) Vaccine (1 of 2) 12/24/2023*   Flu Shot  04/15/2024*   Pap with HPV screening  09/11/2024*   Mammogram  09/11/2024*   DEXA scan (bone density measurement)  09/11/2024*   Colon Cancer Screening  09/11/2024*   Hepatitis C Screening  09/11/2024*   HIV Screening  09/11/2024*   DTaP/Tdap/Td vaccine (2 - Tdap) 09/23/2024*   Pneumococcal Vaccine for age over 41  Completed   Hepatitis B Vaccine  Aged Out   HPV Vaccine  Aged Out   Meningitis B Vaccine  Aged Out  *Topic was postponed. The date shown is not the original due date.
